# Patient Record
Sex: Male | Born: 1995 | Race: White | Hispanic: No | Marital: Single
Health system: Southern US, Community
[De-identification: ages and names within clinical notes are randomized; demographics above are authoritative.]

## PROBLEM LIST (undated history)

## (undated) DIAGNOSIS — R569 Unspecified convulsions: Secondary | ICD-10-CM

## (undated) DIAGNOSIS — Q909 Down syndrome, unspecified: Secondary | ICD-10-CM

## (undated) DIAGNOSIS — Z950 Presence of cardiac pacemaker: Secondary | ICD-10-CM

## (undated) DIAGNOSIS — M109 Gout, unspecified: Secondary | ICD-10-CM

## (undated) DIAGNOSIS — Q212 Atrioventricular septal defect, unspecified as to partial or complete: Secondary | ICD-10-CM

## (undated) HISTORY — DX: Down syndrome, unspecified: Q90.9

## (undated) HISTORY — DX: Atrioventricular septal defect, unspecified as to partial or complete: Q21.20

## (undated) HISTORY — PX: TYMPANOPLASTY: SHX33

## (undated) HISTORY — DX: Gout, unspecified: M10.9

## (undated) HISTORY — PX: ADENOIDECTOMY: SUR15

## (undated) HISTORY — DX: Presence of cardiac pacemaker: Z95.0

## (undated) HISTORY — DX: Unspecified convulsions: R56.9

## (undated) HISTORY — PX: CARDIAC SURGERY: SHX584

---
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
## 2013-02-23 DIAGNOSIS — I5189 Other ill-defined heart diseases: Secondary | ICD-10-CM

## 2013-02-23 HISTORY — DX: Other ill-defined heart diseases: I51.89

## 2015-01-02 DIAGNOSIS — R251 Tremor, unspecified: Secondary | ICD-10-CM | POA: Insufficient documentation

## 2016-12-10 DIAGNOSIS — E669 Obesity, unspecified: Secondary | ICD-10-CM | POA: Insufficient documentation

## 2016-12-10 DIAGNOSIS — E66811 Obesity, class 1: Secondary | ICD-10-CM | POA: Insufficient documentation

## 2016-12-10 DIAGNOSIS — E039 Hypothyroidism, unspecified: Secondary | ICD-10-CM | POA: Insufficient documentation

## 2016-12-10 HISTORY — DX: Hypothyroidism, unspecified: E03.9

## 2018-03-31 DIAGNOSIS — Z01810 Encounter for preprocedural cardiovascular examination: Secondary | ICD-10-CM | POA: Insufficient documentation

## 2018-03-31 DIAGNOSIS — Z45010 Encounter for checking and testing of cardiac pacemaker pulse generator [battery]: Secondary | ICD-10-CM | POA: Insufficient documentation

## 2020-08-06 DIAGNOSIS — H6123 Impacted cerumen, bilateral: Secondary | ICD-10-CM | POA: Insufficient documentation

## 2020-08-06 DIAGNOSIS — H7291 Unspecified perforation of tympanic membrane, right ear: Secondary | ICD-10-CM | POA: Insufficient documentation

## 2020-08-06 DIAGNOSIS — Q179 Congenital malformation of ear, unspecified: Secondary | ICD-10-CM | POA: Insufficient documentation

## 2020-10-04 DIAGNOSIS — K089 Disorder of teeth and supporting structures, unspecified: Secondary | ICD-10-CM | POA: Insufficient documentation

## 2022-02-16 ENCOUNTER — Ambulatory Visit: Payer: Medicaid Other | Admitting: Podiatry

## 2022-02-16 DIAGNOSIS — L84 Corns and callosities: Secondary | ICD-10-CM

## 2022-02-16 DIAGNOSIS — M2141 Flat foot [pes planus] (acquired), right foot: Secondary | ICD-10-CM

## 2022-02-16 DIAGNOSIS — M778 Other enthesopathies, not elsewhere classified: Secondary | ICD-10-CM

## 2022-02-16 DIAGNOSIS — M2142 Flat foot [pes planus] (acquired), left foot: Secondary | ICD-10-CM | POA: Diagnosis not present

## 2022-02-16 NOTE — Progress Notes (Signed)
  Subjective:  Patient ID: Sean George, male    DOB: 1995/08/29,  MRN: [id number]  Chief Complaint  Patient presents with   Callouses    Possible callus to ball of right foot.     26 y.o. male presents with his mother and father for concern for bilateral foot pain related to callus on the ball of the foot.  Right worse than left.  Has been going a long time.  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  Parents state that the patient frequently walks barefoot in the house , also that he prefers to wear flats when walking outside.  Has had the calluses debrided in the past.  Not using lotion on either foot at this time.  No past medical history on file.  Allergies  Allergen Reactions   Latex Rash    ROS: Negative except as per HPI above  Objective:  General: AAO x3, NAD  Dermatological: Pressure calluses present at the plantar aspect of the first MPJ bilateral foot right worse than left.  Tenderness with palpation of this area.  No underlying ulceration noted.  Vascular:  Dorsalis Pedis artery and Posterior Tibial artery pedal pulses are 2/4 bilateral.  Capillary fill time < 3 sec to all digits.   Neruologic: Grossly intact via light touch bilateral. Protective threshold intact to all sites bilateral.   Musculoskeletal: No gross boney pedal deformities bilateral. No pain, crepitus, or limitation noted with foot and ankle range of motion bilateral. Muscular strength 5/5 in all groups tested bilateral.  Gait: Unassisted, Nonantalgic.   No images are attached to the encounter.  Assessment:   1. Callus   2. Pes planus of both feet   3. Capsulitis of foot      Plan:  Patient was evaluated and treated and all questions answered.  # Hyperkeratotic lesion plantar aspect of the first MPJ bilateral foot right worse than left with pain # Pes planus deformity bilaterally All symptomatic hyperkeratoses were safely debrided with a sterile #15 blade to patient's level of  comfort without incident. We discussed preventative and palliative care of these lesions including supportive and accommodative shoegear, padding, prefabricated and custom molded accommodative orthoses, use of a pumice stone and lotions/creams daily. -Recommend use of oofos shoes and daily moisturizing lotion with AmLactin lotion -Recommend use of shoes in the house.  Do not go barefoot. -Discussed possible custom-made orthotics to offload the first MPJ as needed in the future will consider this at future visit.  Return in about 6 weeks (around 03/30/2022) for Follow up bilateral callus, pes planus.          Corinna Gab, DPM [facility]

## 2022-04-13 ENCOUNTER — Ambulatory Visit: Payer: Medicaid Other | Admitting: Podiatry

## 2022-05-11 ENCOUNTER — Ambulatory Visit (INDEPENDENT_AMBULATORY_CARE_PROVIDER_SITE_OTHER): Payer: Medicaid Other | Admitting: Podiatry

## 2022-05-11 DIAGNOSIS — L84 Corns and callosities: Secondary | ICD-10-CM | POA: Diagnosis not present

## 2022-05-11 DIAGNOSIS — M109 Gout, unspecified: Secondary | ICD-10-CM | POA: Diagnosis not present

## 2022-05-11 DIAGNOSIS — H9011 Conductive hearing loss, unilateral, right ear, with unrestricted hearing on the contralateral side: Secondary | ICD-10-CM

## 2022-05-11 DIAGNOSIS — M2141 Flat foot [pes planus] (acquired), right foot: Secondary | ICD-10-CM | POA: Diagnosis not present

## 2022-05-11 DIAGNOSIS — M2142 Flat foot [pes planus] (acquired), left foot: Secondary | ICD-10-CM | POA: Diagnosis not present

## 2022-05-11 HISTORY — DX: Conductive hearing loss, unilateral, right ear, with unrestricted hearing on the contralateral side: H90.11

## 2022-05-11 MED ORDER — METHYLPREDNISOLONE 4 MG PO TBPK: ORAL_TABLET | ORAL | 0 refills | Status: AC

## 2022-05-11 MED ORDER — COLCHICINE 0.6 MG PO TABS: 0.6000 mg | ORAL_TABLET | Freq: Every day | ORAL | 0 refills | Status: DC

## 2022-05-11 NOTE — Progress Notes (Signed)
  Subjective:  Patient ID: Sean George, male    DOB: 09-21-1995,  MRN: [id number]  Chief Complaint  Patient presents with   Callouses    27 y.o. male presents for follow-up on calluses of the bilateral foot.  They state these improved with using a pumice stone and lotion on the area daily.  He has also got new shoes which are helping.  His concern today primarily is related to pain in the right first MPJ.  Has redness and swelling at that area and painful to touch.  Having difficulty walking regarding this.  He has been treated with allopurinol in the past for gout.  No past medical history on file.  Allergies  Allergen Reactions   Lorazepam Other (See Comments)    hyperactivity   Latex Rash   Other Rash    ROS: Negative except as per HPI above  Objective:  General: AAO x3, NAD  Dermatological: Pressure calluses present at the plantar aspect of the first MPJ bilateral foot right worse than left.  Tenderness with palpation of this area.  No underlying ulceration noted.  Vascular:  Dorsalis Pedis artery and Posterior Tibial artery pedal pulses are 2/4 bilateral.  Capillary fill time < 3 sec to all digits.   Neruologic: Grossly intact via light touch bilateral. Protective threshold intact to all sites bilateral.   Musculoskeletal: Attention directed to the first metatarsophalangeal joint on the right foot there is no to be edema and erythema about the joint.  There is pain on palpation of the joint and with range of motion of the first MPJ.  Gait: Unassisted, Nonantalgic.   No images are attached to the encounter.  Assessment:   1. Acute gout of right foot, unspecified cause   2. Callus   3. Pes planus of both feet       Plan:  Patient was evaluated and treated and all questions answered.  #acute gout of right 1st MPJ -Discussed with the patient he does appear to have an acute gout attack of the right first toe. - Recommend treatment with methylprednisolone 4 mg  steroid taper pack take as directed for 6 days -Rx for colchicine 0.6 mg take once daily for 7 days -Follow-up with primary care for uric acid level drawn they are going to see their primary care doctor later today  # Hyperkeratotic lesion plantar aspect of the first MPJ bilateral foot right worse than left with pain # Pes planus deformity bilaterally - Improved with conservative therapy -Recommend use of oofos shoes and daily moisturizing lotion with AmLactin lotion -Recommend use of shoes in the house.  Do not go barefoot.   Return in about 4 weeks (around 06/08/2022) for Follow-up right for gout.          Everitt Amber, DPM [facility]

## 2022-06-12 ENCOUNTER — Ambulatory Visit (INDEPENDENT_AMBULATORY_CARE_PROVIDER_SITE_OTHER): Payer: Medicaid Other | Admitting: Podiatry

## 2022-06-12 DIAGNOSIS — L84 Corns and callosities: Secondary | ICD-10-CM | POA: Diagnosis not present

## 2022-06-12 DIAGNOSIS — M2142 Flat foot [pes planus] (acquired), left foot: Secondary | ICD-10-CM | POA: Diagnosis not present

## 2022-06-12 DIAGNOSIS — M2141 Flat foot [pes planus] (acquired), right foot: Secondary | ICD-10-CM

## 2022-06-12 MED ORDER — KETOCONAZOLE 2 % EX CREA: 1.0000 | TOPICAL_CREAM | Freq: Every day | CUTANEOUS | 0 refills | Status: AC

## 2022-06-12 NOTE — Progress Notes (Signed)
  Subjective:  Patient ID: Sean George, male    DOB: 10-07-1995,  MRN: [id number]  Chief Complaint  Patient presents with   Callouses    Bilateral feet, patient family said that he's doing well, has issues with gout in bilateral feet     27 y.o. male presents for follow-up on calluses of the bilateral foot.  They state these improved with using a pumice stone and lotion on the area daily.  He has also got new shoes which are helping.  His concern today primarily is related to pain in the right first MPJ.  Has redness and swelling at that area and painful to touch.  Having difficulty walking regarding this.  He has been treated with allopurinol in the past for gout.  No past medical history on file.  Allergies  Allergen Reactions   Lorazepam Other (See Comments)    hyperactivity   Latex Rash   Other Rash    ROS: Negative except as per HPI above  Objective:  General: AAO x3, NAD  Dermatological: Pressure calluses present at the plantar aspect of the first MPJ bilateral foot right worse than left.  Tenderness with palpation of this area.  No underlying ulceration noted.  Vascular:  Dorsalis Pedis artery and Posterior Tibial artery pedal pulses are 2/4 bilateral.  Capillary fill time < 3 sec to all digits.   Neruologic: Grossly intact via light touch bilateral. Protective threshold intact to all sites bilateral.   Musculoskeletal: Attention directed to the first metatarsophalangeal joint on the right foot there is no to be edema and erythema about the joint.  There is pain on palpation of the joint and with range of motion of the first MPJ.  Gait: Unassisted, Nonantalgic.   No images are attached to the encounter.  Assessment:   1. Callus   2. Pes planus of both feet        Plan:  Patient was evaluated and treated and all questions answered.  #acute gout of right 1st MPJ -Appears resolved and after steroid pack -Continue to monitor for flareup and call if this  happens we will send another steroid pack  # Hyperkeratotic lesion plantar aspect of the first MPJ bilateral foot right worse than left with pain # Pes planus deformity bilaterally - Improved with conservative therapy -Recommend use of oofos shoes and daily moisturizing lotion with AmLactin lotion -Recommend use of shoes in the house.  Do not go barefoot. -Recommend ketoconazole lotion   Return if symptoms worsen or fail to improve.          Corinna Gab, DPM [facility]

## 2022-07-03 ENCOUNTER — Other Ambulatory Visit: Payer: Self-pay | Admitting: Podiatry

## 2023-12-17 ENCOUNTER — Encounter: Payer: Self-pay | Admitting: Physician Assistant

## 2024-02-07 NOTE — Progress Notes (Deleted)
 02/07/2024 Sean George [id number] 1995-05-02  Referring provider: Zachary Lamar BRAVO, NP Primary GI doctor: {[id number]}  ASSESSMENT AND PLAN:    Patient Care Team: Zachary Lamar BRAVO, NP as PCP - General  HISTORY OF PRESENT ILLNESS: 28 y.o. male with a past medical history listed below presents for evaluation of ***.   01/11/2024 KUB nonobstructive bowel gas pattern, normal CBC c-Met thyroid Saw [facility]  01/11/2024  *** Discussed the use of AI scribe software for clinical note transcription with the patient, who gave verbal consent to proceed.  History of Present Illness            He  has no history on file for tobacco use, alcohol use, and drug use.  RELEVANT GI HISTORY, IMAGING AND LABS: Results          CBC    Component Value Date/Time   WBC 9.1 07/14/2007 0719   RBC 4.30 07/14/2007 0719   HGB 14.6 07/14/2007 0719   HCT 40.6 07/14/2007 0719   PLT  07/14/2007 0719    PLATELET CLUMPS NOTED ON SMEAR, COUNT APPEARS ADEQUATE   MCV 94.5 07/14/2007 0719   MCHC 36.0 07/14/2007 0719   RDW 14.4 07/14/2007 0719   LYMPHSABS 2.5 07/14/2007 0719   MONOABS 1.0 07/14/2007 0719   EOSABS 0.1 07/14/2007 0719   BASOSABS 0.1 07/14/2007 0719   No results for input(s): HGB in the last 8760 hours.  CMP     Component Value Date/Time   NA 142 07/14/2007 0719   K 4.8 HEMOLYZED SPECIMEN, RESULTS MAY BE AFFECTED 07/14/2007 0719   CL 106 07/14/2007 0719   CO2 27 07/14/2007 0719   GLUCOSE 97 07/14/2007 0719   BUN 15 07/14/2007 0719   CREATININE 0.66 07/14/2007 0719   CALCIUM 9.2 07/14/2007 0719   GFRNONAA NOT CALCULATED 07/14/2007 0719   GFRAA  07/14/2007 0719    NOT CALCULATED        The eGFR has been calculated using the MDRD equation. This calculation has not been validated in all clinical       No data to display            Current Medications:   Current Outpatient Medications (Endocrine & Metabolic):    methylPREDNISolone  (MEDROL  DOSEPAK) 4 MG  TBPK tablet, Take as directed for 6 days   predniSONE (DELTASONE) 20 MG tablet, Take 20 mg by mouth daily.   SYNTHROID 88 MCG tablet, Take 88 mcg by mouth every morning.  Current Outpatient Medications (Cardiovascular):    atenolol (TENORMIN) 25 MG tablet, Take 25 mg by mouth daily.  Current Outpatient Medications (Respiratory):    benzonatate (TESSALON) 100 MG capsule, Take 100 mg by mouth 3 (three) times daily as needed.   fluticasone (FLONASE) 50 MCG/ACT nasal spray, 1 spray in each nostril Nasally Once a day   loratadine (CLARITIN) 10 MG tablet, Take 10 mg by mouth daily.  Current Outpatient Medications (Analgesics):    colchicine  0.6 MG tablet, TAKE 1 TABLET(0.6 MG) BY MOUTH DAILY FOR 14 DAYS   ibuprofen (ADVIL) 200 MG tablet, Take by mouth.   meloxicam (MOBIC) 15 MG tablet, Take 15 mg by mouth daily.   Current Outpatient Medications (Other):    azithromycin (ZITHROMAX) 250 MG tablet, Take 250 mg by mouth as directed.   escitalopram (LEXAPRO) 10 MG tablet, Take 10 mg by mouth daily.   ketoconazole  (NIZORAL ) 2 % cream, Apply 1 Application topically daily.   sulfamethoxazole-trimethoprim (BACTRIM DS) 800-160 MG tablet, Take  1 tablet by mouth 2 (two) times daily.   traZODone (DESYREL) 50 MG tablet, Take 50 mg by mouth at bedtime as needed.   triamcinolone cream (KENALOG) 0.5 %, Apply topically daily.  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 Family History:  His family history is not on file.  REVIEW OF SYSTEMS  : All other systems reviewed and negative except where noted in the History of Present Illness.  PHYSICAL EXAM: There were no vitals taken for this visit. Physical Exam          Alan JONELLE Coombs, PA-C 10:23 AM

## 2024-02-08 ENCOUNTER — Ambulatory Visit: Payer: MEDICAID | Admitting: Physician Assistant
